# Patient Record
Sex: Male | Born: 1990 | Race: White | Hispanic: No | Marital: Single | Smoking: Former smoker
Health system: Southern US, Community
[De-identification: ages and names within clinical notes are randomized; demographics above are authoritative.]

## PROBLEM LIST (undated history)

## (undated) HISTORY — PX: WISDOM TOOTH EXTRACTION: SHX21

---
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
## 2011-03-18 LAB — DRUGS OF ABUSE SCREEN W/O ALC, ROUTINE URINE
Amphetamine Screen, Ur: NEGATIVE
Benzodiazepines.: NEGATIVE
Marijuana Metabolite: NEGATIVE
Phencyclidine (PCP): NEGATIVE

## 2011-03-18 LAB — COMPREHENSIVE METABOLIC PANEL
Alkaline Phosphatase: 93
CO2: 28
Calcium: 9.7
Chloride: 103
Creatinine, Ser: 1.09
Sodium: 138
Total Bilirubin: 0.8
Total Protein: 6.7

## 2011-03-18 LAB — URINALYSIS, ROUTINE W REFLEX MICROSCOPIC
Glucose, UA: NEGATIVE
Nitrite: NEGATIVE
Urobilinogen, UA: 0.2

## 2011-03-18 LAB — CBC
HCT: 44.8 — ABNORMAL HIGH
Platelets: 330
RBC: 5.26 — ABNORMAL HIGH

## 2011-03-18 LAB — DIFFERENTIAL
Neutro Abs: 4.9
Neutrophils Relative %: 64

## 2011-03-18 LAB — T4, FREE: Free T4: 1.18

## 2011-03-18 LAB — TSH: TSH: 1.995

## 2011-09-24 ENCOUNTER — Encounter: Payer: Self-pay | Admitting: Family Medicine

## 2011-09-24 ENCOUNTER — Ambulatory Visit (INDEPENDENT_AMBULATORY_CARE_PROVIDER_SITE_OTHER): Payer: BC Managed Care – PPO | Admitting: Family Medicine

## 2011-09-24 VITALS — BP 112/61 | HR 65 | Temp 97.8°F | Resp 16 | Ht 70.5 in | Wt 148.0 lb

## 2011-09-24 DIAGNOSIS — R591 Generalized enlarged lymph nodes: Secondary | ICD-10-CM

## 2011-09-24 DIAGNOSIS — Z0000 Encounter for general adult medical examination without abnormal findings: Secondary | ICD-10-CM

## 2011-09-24 DIAGNOSIS — Z720 Tobacco use: Secondary | ICD-10-CM

## 2011-09-24 LAB — CBC WITH DIFFERENTIAL/PLATELET
Basophils Relative: 0 % (ref 0–1)
Lymphs Abs: 1.9 10*3/uL (ref 0.7–4.0)
Monocytes Relative: 10 % (ref 3–12)
Neutro Abs: 4.1 10*3/uL (ref 1.7–7.7)
Neutrophils Relative %: 61 % (ref 43–77)
WBC: 6.8 10*3/uL (ref 4.0–10.5)

## 2011-09-24 LAB — COMPREHENSIVE METABOLIC PANEL
ALT: 13 U/L (ref 0–53)
AST: 20 U/L (ref 0–37)
Calcium: 9.8 mg/dL (ref 8.4–10.5)
Chloride: 103 mEq/L (ref 96–112)
Potassium: 4.2 mEq/L (ref 3.5–5.3)
Sodium: 138 mEq/L (ref 135–145)
Total Protein: 6.8 g/dL (ref 6.0–8.3)

## 2011-09-24 NOTE — Progress Notes (Signed)
  Subjective:    Patient ID: Erik [name], male    DOB: [birth year], 21 y.o.   MRN: [id number]  HPI Erik [name] is a 21 y.o. male Here for CPE.  Back problems for 2-3 months. NKI.  played lacrosse a year ago and weightlifting. Has chiropracter, that has done xrays.  Functional scoliosis - doing better.  No known hx of spondylolysis.  R shoulder sore at times.  NKI recently, prior Lacrosse.   [employer] - undecided [employer]jr.  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  Going to conference next week on collegiate sobriety, in support group locally - [facility].  Doing well now.  Not currently sexually active - last was 13 months ago and had STD testing then.   Tobacco use - 1 ppd for 10 months. Tried to quit with patch before - didn't go well, not ready to quit then or now.     Review of Systems  See 13 point ros on phs- scanned.    Objective:   Physical Exam  Constitutional: He is oriented to person, place, and time. He appears well-developed and well-nourished.  HENT:  Head: Normocephalic and atraumatic.  Right Ear: External ear normal.  Left Ear: External ear normal.  Mouth/Throat: Oropharynx is clear and moist.  Eyes: Conjunctivae and EOM are normal. Pupils are equal, round, and reactive to light.  Neck: Normal range of motion. No thyromegaly present.    Cardiovascular: Normal rate, regular rhythm, normal heart sounds and intact distal pulses.   Pulmonary/Chest: Effort normal and breath sounds normal. He has no wheezes.  Genitourinary:       Exam declined.  Musculoskeletal: He exhibits no edema and no tenderness.       Lumbar back: He exhibits normal range of motion, no tenderness, no bony tenderness, no deformity and no spasm.       Negative stork test bilaterally.  Neurological: He is alert and oriented to person, place, and time.  Skin: Skin is warm and dry.  Psychiatric: He has a normal mood  and affect. His behavior is normal.        Assessment & Plan:  Annual exam/cpe.  No concerning findings on exam or high risk behaviors identified currently except tobacco use, but in contemplative stage.  When he is ready to quit, he plans on calling me or rtc to discuss options.. Age appropriate health guidance given.  Encouraged him about his sobriety form narcotics - continue support structure.  Given prior addiction, and does not remember any blood work - will check CMP.  Menactra and Gardasil discussed - had some immunizations before college - but not sure which ones - can check on those.  Check CBC with nodes palpated in the neck, but no apparent change by his hx.  Back pain - under care of chiropractor, but can rtc if not improving or changes.

## 2011-10-13 ENCOUNTER — Ambulatory Visit (INDEPENDENT_AMBULATORY_CARE_PROVIDER_SITE_OTHER): Payer: BC Managed Care – PPO | Admitting: Family Medicine

## 2011-10-13 ENCOUNTER — Encounter: Payer: Self-pay | Admitting: Family Medicine

## 2011-10-13 VITALS — BP 111/70 | HR 66 | Temp 99.0°F | Resp 16 | Ht 70.0 in | Wt 149.0 lb

## 2011-10-13 DIAGNOSIS — B86 Scabies: Secondary | ICD-10-CM

## 2011-10-13 MED ORDER — PERMETHRIN 5 % EX CREA: TOPICAL_CREAM | Freq: Once | CUTANEOUS | Status: AC

## 2011-10-13 NOTE — Progress Notes (Signed)
  Subjective:    Patient ID: Erik [name], male    DOB: [birth year], 21 y.o.   MRN: [id number]  HPI 21 yo male here with rash.  Going on 3 days.  Small red bumps, waist line, groin, low back, forearms.  Has done no meds or creams.   Exposed to scabies around 4-6 weeks ago.  And again more recently.  Review of Systems Negative except as per HPI     Objective:   Physical Exam Scattered, pinpoint erythematous lesions at waisline, inner thigh/groin, forearms.        Assessment & Plan:  Rash - likely scabies.  Permethrin cream.

## 2012-03-10 ENCOUNTER — Encounter: Payer: Self-pay | Admitting: Family Medicine

## 2012-03-10 ENCOUNTER — Ambulatory Visit: Payer: BC Managed Care – PPO

## 2012-03-10 ENCOUNTER — Ambulatory Visit (INDEPENDENT_AMBULATORY_CARE_PROVIDER_SITE_OTHER): Payer: BC Managed Care – PPO | Admitting: Family Medicine

## 2012-03-10 VITALS — BP 112/64 | HR 79 | Temp 99.1°F | Resp 16 | Ht 70.0 in | Wt 145.8 lb

## 2012-03-10 DIAGNOSIS — M24559 Contracture, unspecified hip: Secondary | ICD-10-CM

## 2012-03-10 DIAGNOSIS — M25519 Pain in unspecified shoulder: Secondary | ICD-10-CM

## 2012-03-10 DIAGNOSIS — M549 Dorsalgia, unspecified: Secondary | ICD-10-CM

## 2012-03-10 DIAGNOSIS — M629 Disorder of muscle, unspecified: Secondary | ICD-10-CM

## 2012-03-10 DIAGNOSIS — M542 Cervicalgia: Secondary | ICD-10-CM

## 2012-03-10 DIAGNOSIS — M419 Scoliosis, unspecified: Secondary | ICD-10-CM

## 2012-03-10 DIAGNOSIS — M7630 Iliotibial band syndrome, unspecified leg: Secondary | ICD-10-CM

## 2012-03-10 MED ORDER — MELOXICAM 7.5 MG PO TABS: 7.5000 mg | ORAL_TABLET | Freq: Every day | ORAL | Status: AC

## 2012-03-10 NOTE — Progress Notes (Signed)
Subjective:    Patient ID: Erik [name], male    DOB: [birth year], 21 y.o.   MRN: [id number]  HPI Erik [name] is a 21 y.o. male Last OV for CPE in 3/13. Here for follow up of back and shoulder pain.   Back pain - Back problems for 2-3 months at last ov.  NKI.  played lacrosse a year ago and weightlifting. Had chiropracter, that has done xrays.  Functional scoliosis - doing better.  No known hx of spondylolysis.  No improvement with chiropracter - stopped 3 months ago.  Feels worse - L more than right.  Neck tight at times.occasional headache. No PT for back. Would like to see [facility]. Stretches at home on own - no rx program. Probable PT. Hyperlax in past. Has had very flexible joints.   R shoulder sore at times at last ov. NKI recently, prior Lacrosse, and heavy weightlifting in past, used creatinine in past, none recently. Marland Kitchen  Now with both shoulders hurting - 5 months of pain in Left. R hand dominant.  Radiates from neck to shoulders.  tried weights - light and home rehab.   Hip pain - outside of hips - both sides - pops at times in am.  NKI.  More sore in am.  Tx: occasional advil. No regular use.   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 in support group locally - [facility]. Doing well, planning on substance abuse counseling.     Review of Systems  Musculoskeletal: Positive for myalgias, back pain and arthralgias. Negative for joint swelling.  Neurological: Negative for weakness.       Objective:   Physical Exam  Constitutional: He is oriented to person, place, and time. He appears well-developed and well-nourished.  HENT:  Head: Normocephalic and atraumatic.  Cardiovascular: Normal rate, regular rhythm, normal heart sounds and intact distal pulses.   Pulmonary/Chest: Effort normal.  Musculoskeletal:       Right shoulder: Normal. He  exhibits normal range of motion, no tenderness and no bony tenderness.       Left shoulder: Normal. He exhibits normal range of motion.       Right hip: He exhibits normal range of motion.       Cervical back: He exhibits spasm (few areas paraspinal and to top of shoulder with ttp.  ). He exhibits normal range of motion and no bony tenderness.       Back:       Legs:      Full rtc strength.  Described areas of shoulders on top and radiating pain from trapezius.   Neurological: He is alert and oriented to person, place, and time. He has normal strength. No sensory deficit.  Reflex Scores:      Tricep reflexes are 2+ on the right side and 2+ on the left side.      Bicep reflexes are 2+ on the right side and 2+ on the left side.      Brachioradialis reflexes are 2+ on the right side and 2+ on the left side.      Patellar reflexes are 2+ on the right side and 2+ on the left side.      Achilles reflexes are 2+ on the right side and 2+ on the left side.  [facility] reading (PRIMARY) by  Dr. Neva Seat:  c-spine: NAD ls spine:  Slight scoliosis/pronounced curvature. NAD o/w.      Assessment & Plan:  Erik [name] is a 21 y.o. male 1. Back pain  DG Thoracic Spine 2 View, DG Cervical Spine 2-3 Views, meloxicam (MOBIC) 7.5 MG tablet, Ambulatory referral to Orthopedic Surgery  2. Shoulder pain  DG Thoracic Spine 2 View, DG Cervical Spine 2-3 Views, meloxicam (MOBIC) 7.5 MG tablet  3. IT band syndrome    4. Neck pain  meloxicam (MOBIC) 7.5 MG tablet, Ambulatory referral to Orthopedic Surgery  5. Scoliosis  Ambulatory referral to Orthopedic Surgery   Neck to shoulder pain - bilaterally.  Suspect some mechanical/spasm component, body mechanics discussed. hx of hyperlaxity.  Trial of HEP per neck care manual, mobic 7.5mg  QD, and referral to spine and scoliosis specialists.  Back pain - intermittent - as above - suspect mechanical component.  Back care manual, mobic, heat/ice prn.  rtc precautions and refer to  specialists.   Hip pain - suspect IT band syndrome.  tx as above. Demonstrated it stretch.  Consider ortho eval if persists.

## 2012-03-10 NOTE — Patient Instructions (Signed)
We will refer you to the spine specialists.  Try the exercises discussed and in handouts and Mobic as prescribed.  Return to the clinic or go to the nearest emergency room if any of your symptoms worsen or new symptoms occur.

## 2012-03-23 ENCOUNTER — Telehealth: Payer: Self-pay

## 2012-03-23 NOTE — Telephone Encounter (Signed)
PT IS IN OFFICE NOW                LAUREN FROM [facility] STATES DR Neva Seat REFERRED PT OVER AND THEY ARE IN NEED OF HIS RECORDS,. THEY DO NOT HAVE EPIC PLEASE FAX TO [phone] AND THE PHONE NUMBER IS [phone]     PT IS IN OFFICE NOW PT IS IN OFFICE NOW

## 2012-03-23 NOTE — Telephone Encounter (Signed)
Records sent

## 2012-05-29 ENCOUNTER — Ambulatory Visit: Payer: BC Managed Care – PPO | Admitting: Family Medicine

## 2014-05-31 IMAGING — CR DG CERVICAL SPINE 2 OR 3 VIEWS
2 series · 2 of 2 positions shown · non-contrast
Comparison: Cervical spine MRI 09/23/2009 [HOSPITAL].

CLINICAL DATA: Neck pain.

CERVICAL SPINE - 2-3 VIEW

[lateral]
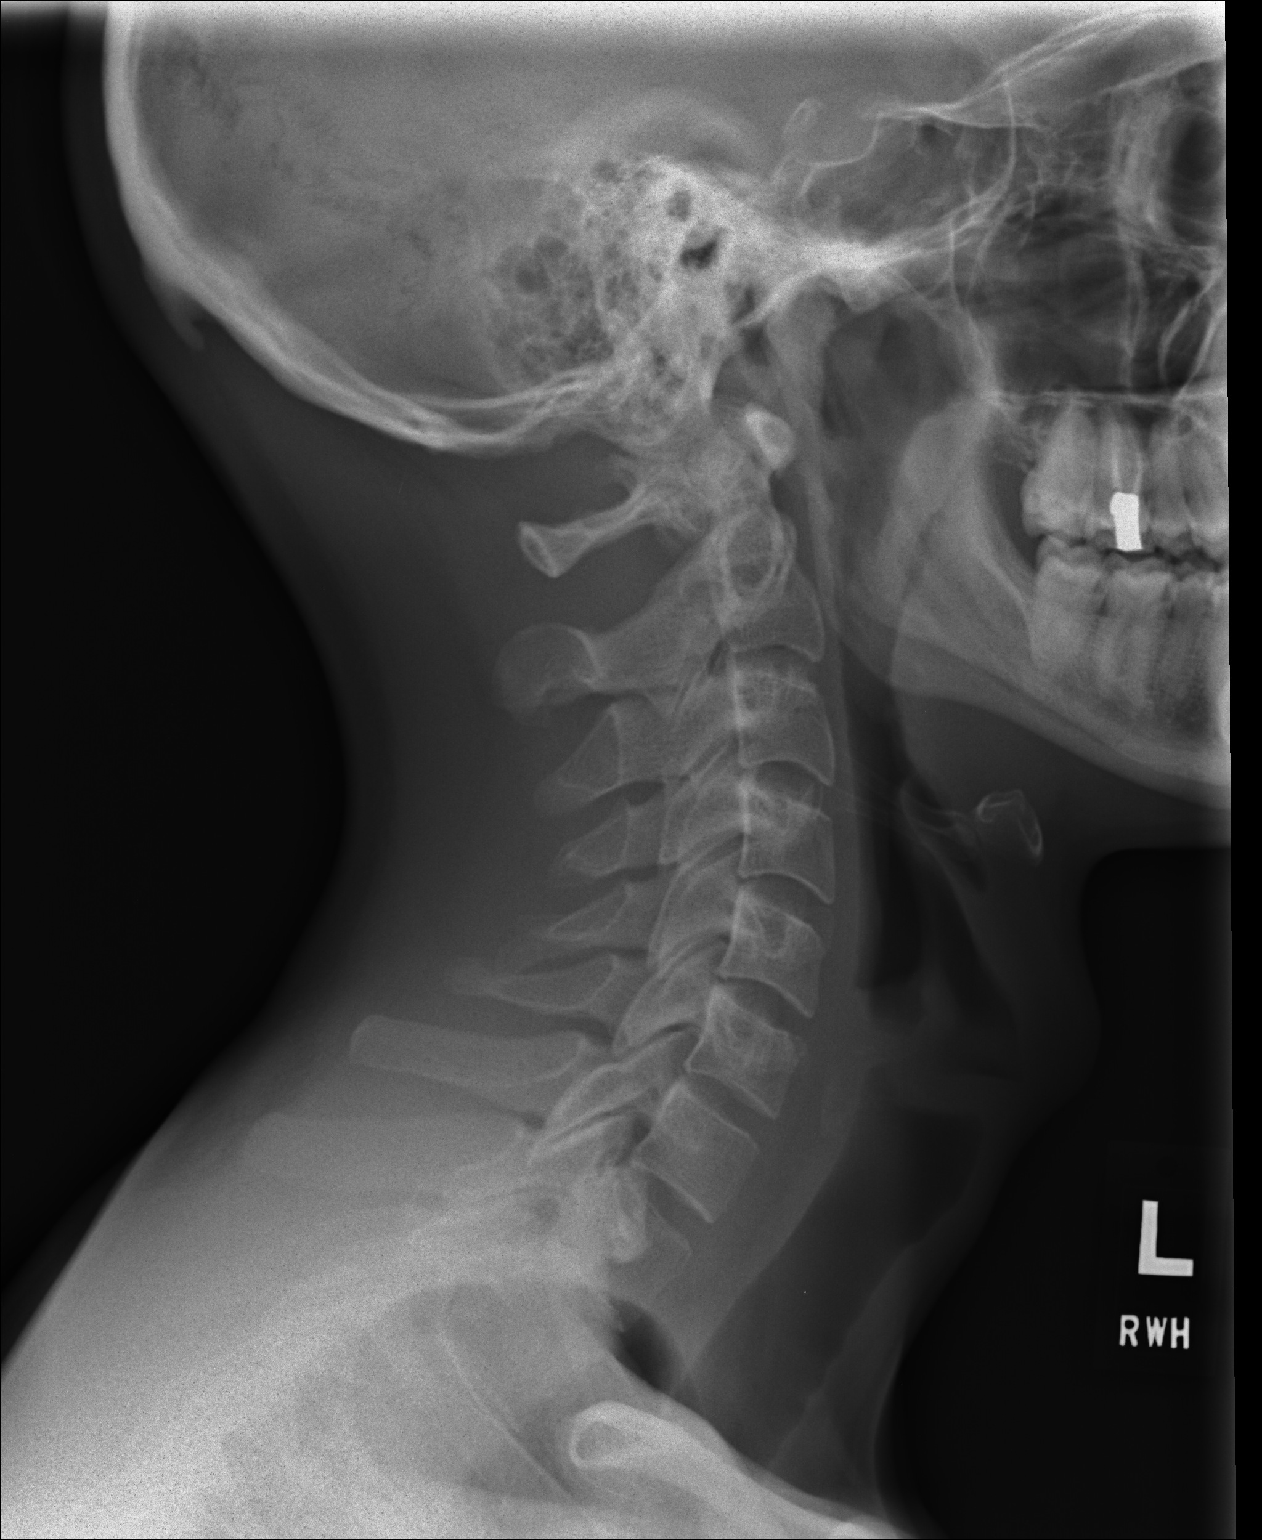

[AP]
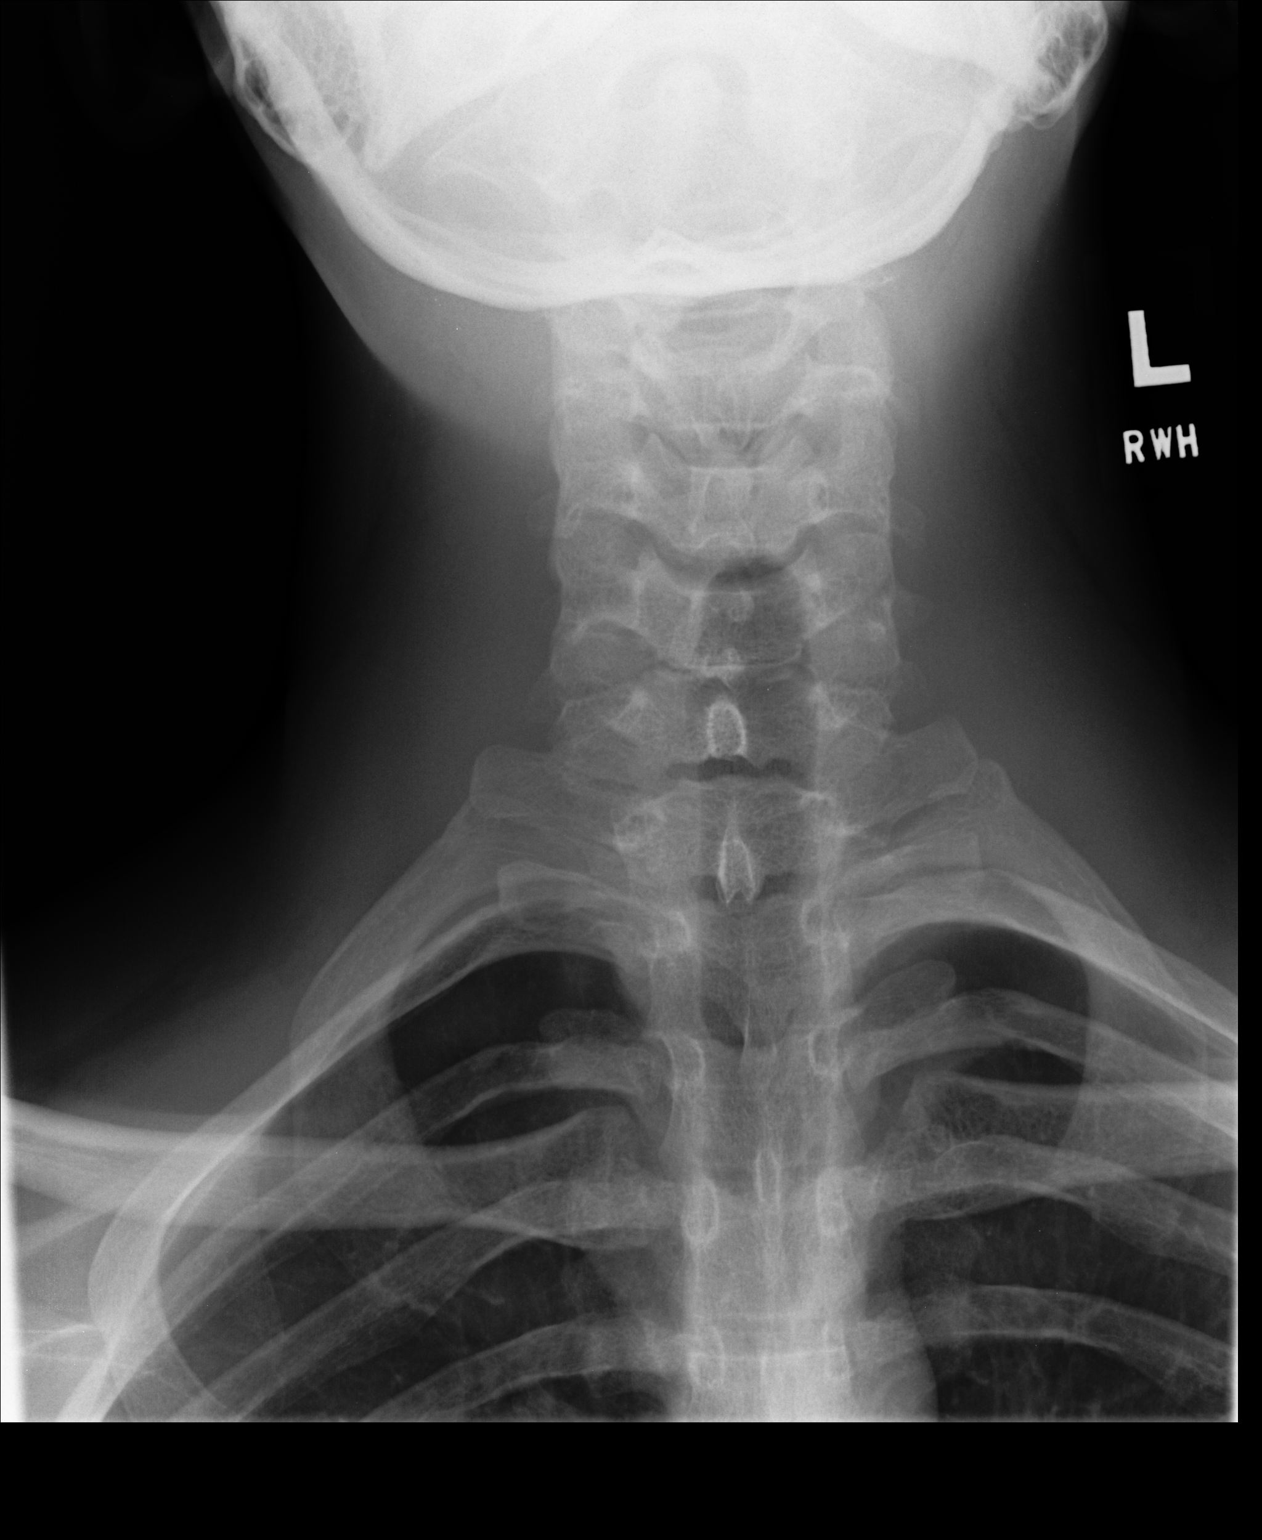

[2 of 2 positions shown; findings below may reference images not displayed]

FINDINGS: Anatomic alignment.  No visible fractures.  Well-
preserved disc spaces.  Normal prevertebral soft tissues.  No
visible facet degenerative changes.
IMPRESSION: Normal examination.

Clinically significant discrepancy from primary report, if
provided: None

## 2016-07-13 DIAGNOSIS — Z13220 Encounter for screening for lipoid disorders: Secondary | ICD-10-CM | POA: Diagnosis not present

## 2016-07-13 DIAGNOSIS — Z0000 Encounter for general adult medical examination without abnormal findings: Secondary | ICD-10-CM | POA: Diagnosis not present

## 2016-07-13 DIAGNOSIS — Z23 Encounter for immunization: Secondary | ICD-10-CM | POA: Diagnosis not present

## 2016-07-13 DIAGNOSIS — Z6823 Body mass index (BMI) 23.0-23.9, adult: Secondary | ICD-10-CM | POA: Diagnosis not present

## 2016-08-26 ENCOUNTER — Encounter: Payer: Self-pay | Admitting: Cardiovascular Disease

## 2016-08-26 ENCOUNTER — Ambulatory Visit (INDEPENDENT_AMBULATORY_CARE_PROVIDER_SITE_OTHER): Payer: BLUE CROSS/BLUE SHIELD | Admitting: Cardiovascular Disease

## 2016-08-26 VITALS — BP 120/62 | HR 78 | Ht 71.0 in | Wt 170.0 lb

## 2016-08-26 DIAGNOSIS — I341 Nonrheumatic mitral (valve) prolapse: Secondary | ICD-10-CM | POA: Diagnosis not present

## 2016-08-26 DIAGNOSIS — R002 Palpitations: Secondary | ICD-10-CM | POA: Diagnosis not present

## 2016-08-26 DIAGNOSIS — R011 Cardiac murmur, unspecified: Secondary | ICD-10-CM | POA: Insufficient documentation

## 2016-08-26 NOTE — Progress Notes (Signed)
Cardiology Office Note    Date:  08/26/2016   ID:  Erik [name], DOB 1990/12/15, MRN [id number]  PCP:  Vivien Presto, MD  Cardiologist:   Thurmon Fair, MD   Chief Complaint  Patient presents with  . New Evaluation    History of Present Illness:  Erik [name] is a 26 y.o. male without previous known cardiac or other serious medical illness presenting with complaints of palpitations.  He remembers having palpitations as a child/adolescent and a physician taught him how to perform vagal maneuvers which were successful in stopping the palpitations. They haven't bothered him for many years, until recently has become much more interested in physical fitness. He exercises 6 days a week alternating between cardio and weight exercises. He looks very fit. When he is really pushing the intensity of his exercise he will often notices frequent palpitations. At peak exercise she will then develop abrupt onset of rapid palpitations. These generally last for about 10-15 seconds and stop abruptly following a vagal maneuver. There not associated with dizziness, syncope, angina or particularly worsened shortness of breath (since it is happening at intense exercise he can't really tell). He has purchased a Armed forces operational officer device and has captured one of these episodes. There is a lot of artifact since he is immediately post exercise, but he seems to have a narrow complex regular tachycardia (can't distinguish sinus tachycardia versus true arrhythmia). He has documented heart rates as fast as the low 190s.  He was told by Dr. Doristine Counter in the past that he has a physical exam consistent with mitral valve prolapse. He has never had an echocardiogram.  Erik [name] that he suffers with a lot of anxiety and that often this will bring on brief palpitations. The sustained palpitations only happen following intense physical exercise.  Past medical history: History of substance abuse, abstinent for years,  anxiety.  Past Surgical History:  Procedure Laterality Date  . WISDOM TOOTH EXTRACTION      Current Medications: Outpatient Medications Prior to Visit  Medication Sig Dispense Refill  . Fish Oil-Cholecalciferol (FISH OIL + D3) 1200-1000 MG-UNIT CAPS Take 200 mg by mouth daily.    . Multiple Vitamin (MULTIVITAMIN) tablet Take 1 tablet by mouth daily.     No facility-administered medications prior to visit.      Allergies:   Patient has no allergy information on record.   Social History   Social History  . Marital status: Single    Spouse name: N/A  . Number of children: N/A  . Years of education: N/A   Social History Main Topics  . Smoking status: Former Smoker    Packs/day: 1.00    Types: Cigarettes  . Smokeless tobacco: Never Used  . Alcohol use Not on file  . Drug use: Unknown  . Sexual activity: Not on file   Other Topics Concern  . Not on file   Social History Narrative  . No narrative on file     Family History:  The patient's family history includes Diabetes in his father and maternal grandfather; Hypertension in his mother.   ROS:   Please see the history of present illness.    ROS All other systems reviewed and are negative.   PHYSICAL EXAM:   VS:  BP 120/62 (BP Location: Left Arm, Patient Position: Sitting, Cuff Size: Normal)   Pulse 78   Ht 5\' 11"  (1.803 m)   Wt 77.1 kg (170 lb)   BMI 23.71 kg/m    GEN:  Well nourished, well developed, in no acute distress  HEENT: normal  Neck: no JVD, carotid bruits, or masses Cardiac: RRR; no murmurs, rubs, or gallops,no edema. Residual very distinct early systolic click that is intensified with the Valsalva maneuver, but there is no associated murmur even with provocation. In addition he has a widely split second heart sound.  Respiratory:  clear to auscultation bilaterally, normal work of breathing GI: soft, nontender, nondistended, + BS MS: no deformity or atrophy  Skin: warm and dry, no rash Neuro:  Alert  and Oriented x 3, Strength and sensation are intact Psych: euthymic mood, full affect  Wt Readings from Last 3 Encounters:  08/26/16 77.1 kg (170 lb)  03/10/12 66.1 kg (145 lb 12.8 oz)  10/13/11 67.6 kg (149 lb) (40 %, Z= -0.24)*   * Growth percentiles are based on CDC 2-20 Years data.      Studies/Labs Reviewed:   EKG:  EKG is ordered today.  The ekg ordered today demonstrates Normal sinus rhythm with incomplete right bundle branch block, rSr' most prominent in lead V2. QRS 102 ms. PR 198 ms. Normal PR interval, no evidence of delta wave or other signs of preexcitation. There is no evidence of Brugada syndrome-type repolarization changes; there is no evidence of epsilon wave or other signs of ARVC. The QT interval is normal (QTC 433 ms).   ASSESSMENT:    1. Mitral valve prolapse   2. Heart palpitations   3. Systolic click      PLAN:  In order of problems listed above:  1. 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 The episodes are extremely brief, not particularly symptomatic and infrequent. Erik [name] prefers not to take medication for them. I simply suggested that he ratcheted back his maximum exercise by 5% to help prevent these episodes from happening. Discussed the possibility that he could undergo radiofrequency ablation if the symptoms become more troublesome. Encouraged him to try to catch a clean episode with less artifact on his smart phone. 2. Systolic click: Erik [name]'s physical exam is strongly suggestive of mitral valve prolapse, but without murmur of mitral regurgitation. We'll confirm this with an echocardiogram which will also help screen for other structural cardiac abnormalities that could explain  arrhythmiaCorrelate with his incomplete right bundle branch block. Discussed the fact that mitral valve prolapse is a very benign disorder especially in his age group.    Medication Adjustments/Labs and Tests Ordered: Current medicines are reviewed at length with the patient today.  Concerns regarding medicines are outlined above.  Medication changes, Labs and Tests ordered today are listed in the Patient Instructions below. Patient Instructions  Your physician has requested that you have an echocardiogram. Echocardiography is a painless test that uses sound waves to create images of your heart. It provides your doctor with information about the size and shape of your heart and how well your heart's chambers and valves are working. This procedure takes approximately one hour. There are no restrictions for this procedure. [facility] be performed at [facility] - [address], [address].  Dr Royann Shiversroitoru recommends that you follow-up with him as needed.    Signed, Thurmon FairMihai Analyse Angst, MD  08/26/2016 11:07 AM    [facility] [address], [place], [place]  [address] Phone: [phone]; Fax: [phone]

## 2016-08-26 NOTE — Patient Instructions (Signed)
Your physician has requested that you have an echocardiogram. Echocardiography is a painless test that uses sound waves to create images of your heart. It provides your doctor with information about the size and shape of your heart and how well your heart's chambers and valves are working. This procedure takes approximately one hour. There are no restrictions for this procedure. This will be performed at our [address] location - [address], [address].  Dr Croitoru recommends that you follow-up with him as needed. 

## 2016-09-14 ENCOUNTER — Other Ambulatory Visit: Payer: Self-pay

## 2016-09-14 ENCOUNTER — Ambulatory Visit (HOSPITAL_COMMUNITY): Payer: BLUE CROSS/BLUE SHIELD | Attending: Cardiology

## 2016-09-14 DIAGNOSIS — I341 Nonrheumatic mitral (valve) prolapse: Secondary | ICD-10-CM | POA: Insufficient documentation

## 2017-11-18 DIAGNOSIS — M79641 Pain in right hand: Secondary | ICD-10-CM | POA: Diagnosis not present

## 2017-11-18 DIAGNOSIS — S62306A Unspecified fracture of fifth metacarpal bone, right hand, initial encounter for closed fracture: Secondary | ICD-10-CM | POA: Diagnosis not present

## 2017-11-22 ENCOUNTER — Ambulatory Visit: Payer: Self-pay | Admitting: Family Medicine

## 2017-11-28 DIAGNOSIS — M79641 Pain in right hand: Secondary | ICD-10-CM | POA: Diagnosis not present

## 2017-12-16 DIAGNOSIS — M79641 Pain in right hand: Secondary | ICD-10-CM | POA: Diagnosis not present

## 2018-05-31 DIAGNOSIS — D229 Melanocytic nevi, unspecified: Secondary | ICD-10-CM | POA: Diagnosis not present

## 2018-05-31 DIAGNOSIS — Z23 Encounter for immunization: Secondary | ICD-10-CM | POA: Diagnosis not present

## 2018-05-31 DIAGNOSIS — Z6823 Body mass index (BMI) 23.0-23.9, adult: Secondary | ICD-10-CM | POA: Diagnosis not present

## 2018-05-31 DIAGNOSIS — Z0000 Encounter for general adult medical examination without abnormal findings: Secondary | ICD-10-CM | POA: Diagnosis not present

## 2018-06-06 DIAGNOSIS — D2261 Melanocytic nevi of right upper limb, including shoulder: Secondary | ICD-10-CM | POA: Diagnosis not present

## 2018-06-06 DIAGNOSIS — D225 Melanocytic nevi of trunk: Secondary | ICD-10-CM | POA: Diagnosis not present

## 2018-06-06 DIAGNOSIS — D229 Melanocytic nevi, unspecified: Secondary | ICD-10-CM | POA: Diagnosis not present

## 2018-06-06 DIAGNOSIS — D2361 Other benign neoplasm of skin of right upper limb, including shoulder: Secondary | ICD-10-CM | POA: Diagnosis not present

## 2019-05-08 DIAGNOSIS — M9901 Segmental and somatic dysfunction of cervical region: Secondary | ICD-10-CM | POA: Diagnosis not present

## 2019-05-15 DIAGNOSIS — M9901 Segmental and somatic dysfunction of cervical region: Secondary | ICD-10-CM | POA: Diagnosis not present
